# Patient Record
Sex: Male | Born: 1990 | Race: White | Hispanic: No | Marital: Married | Smoking: Current every day smoker
Health system: Southern US, Community
[De-identification: ages and names within clinical notes are randomized; demographics above are authoritative.]

## PROBLEM LIST (undated history)

## (undated) DIAGNOSIS — J45909 Unspecified asthma, uncomplicated: Secondary | ICD-10-CM

---

## 2016-04-04 ENCOUNTER — Emergency Department (HOSPITAL_COMMUNITY)
Admission: EM | Admit: 2016-04-04 | Discharge: 2016-04-04 | Disposition: A | Discharge status: Home / Self Care | Payer: Self-pay | Attending: Emergency Medicine | Admitting: Emergency Medicine

## 2016-04-04 ENCOUNTER — Encounter (HOSPITAL_COMMUNITY): Payer: Self-pay

## 2016-04-04 ENCOUNTER — Emergency Department (HOSPITAL_COMMUNITY): Payer: Self-pay

## 2016-04-04 DIAGNOSIS — Y999 Unspecified external cause status: Secondary | ICD-10-CM | POA: Insufficient documentation

## 2016-04-04 DIAGNOSIS — S8001XA Contusion of right knee, initial encounter: Secondary | ICD-10-CM | POA: Insufficient documentation

## 2016-04-04 DIAGNOSIS — W1839XA Other fall on same level, initial encounter: Secondary | ICD-10-CM | POA: Insufficient documentation

## 2016-04-04 DIAGNOSIS — F17200 Nicotine dependence, unspecified, uncomplicated: Secondary | ICD-10-CM | POA: Insufficient documentation

## 2016-04-04 DIAGNOSIS — S80211A Abrasion, right knee, initial encounter: Secondary | ICD-10-CM

## 2016-04-04 DIAGNOSIS — Y939 Activity, unspecified: Secondary | ICD-10-CM | POA: Insufficient documentation

## 2016-04-04 DIAGNOSIS — Y929 Unspecified place or not applicable: Secondary | ICD-10-CM | POA: Insufficient documentation

## 2016-04-04 MED ORDER — IBUPROFEN 600 MG PO TABS: 600.0000 mg | ORAL_TABLET | Freq: Four times a day (QID) | ORAL | 0 refills | Status: DC | PRN

## 2016-04-04 NOTE — Discharge Instructions (Signed)
Ice and elevate your knee. Bacitracin twice a day for wound care. Watch for signs of infection. Ibuprofen for pain. Follow up with your doctor or urgent care as needed.

## 2016-04-04 NOTE — ED Triage Notes (Signed)
Pt fell from the back of a truck He complains of right knee pain and an abrasion

## 2016-04-04 NOTE — ED Notes (Signed)
ED Provider at bedside. 

## 2016-04-04 NOTE — ED Provider Notes (Signed)
[facility]-EMERGENCY DEPT Provider Note   CSN: [id number] Arrival date & time: 04/04/16  0429     History   Chief Complaint Chief Complaint  Patient presents with  . Abrasion    HPI David Bowman is a 25 y.o. male.  HPI David Bowman is a 25 y.o. male presents to emergency department after a fall. Patient states that he fell from the back of the truck and landed on his right knee. He denies hitting his head on loss of consciousness. Denies any back pain. No other injuries. Patient is ambulatory but states is painful to walk on it. He's got an abrasion to the right anterior knee, states that his tetanus is up-to-date. He denies any numbness or weakness distal to the injury.  History reviewed. No pertinent past medical history.  There are no active problems to display for this patient.   History reviewed. No pertinent surgical history.     Home Medications    Prior to Admission medications   Not on File    Family History History reviewed. No pertinent family history.  Social History Social History  Substance Use Topics  . Smoking status: Current Every Day Smoker  . Smokeless tobacco: Never Used  . Alcohol use Yes     Allergies   Review of patient's allergies indicates not on file.   Review of Systems Review of Systems  Constitutional: Negative for chills and fever.  Respiratory: Negative for cough, chest tightness and shortness of breath.   Cardiovascular: Negative for chest pain, palpitations and leg swelling.  Gastrointestinal: Negative for abdominal distention, abdominal pain, diarrhea, nausea and vomiting.  Musculoskeletal: Positive for arthralgias and myalgias. Negative for neck pain and neck stiffness.  Skin: Positive for wound.  Allergic/Immunologic: Negative for immunocompromised state.  Neurological: Negative for dizziness, weakness, light-headedness, numbness and headaches.  All other systems reviewed and are negative.    Physical Exam Updated  Vital Signs BP 107/68 (BP Location: Right Arm)   Pulse 64   Temp 97.7 F (36.5 C) (Oral)   Resp 20   SpO2 99%   Physical Exam  Constitutional: He appears well-developed and well-nourished. No distress.  HENT:  Head: Normocephalic and atraumatic.  Eyes: Conjunctivae are normal.  Neck: Neck supple.  Cardiovascular: Normal rate, regular rhythm and normal heart sounds.   Pulmonary/Chest: Effort normal. No respiratory distress. He has no wheezes. He has no rales.  Musculoskeletal: He exhibits no edema.  Superficial abrasion on right anterior right knee. Mild swelling over patella. Full ROM of the right knee. TTP over patella. No medial or lateral joint tenderness. Negative anterior or posterior drawer sign. No laxity with medial or lateral stress. Patella tendon intact. DP pulses intact.   Neurological: He is alert.  Skin: Skin is warm and dry. Capillary refill takes less than 2 seconds.  Nursing note and vitals reviewed.    ED Treatments / Results  Labs (all labs ordered are listed, but only abnormal results are displayed) Labs Reviewed - No data to display  EKG  EKG Interpretation None       Radiology Dg Knee 2 Views Right  Result Date: 04/04/2016 CLINICAL DATA:  25 y/o M; fall off a back of truck landing on right knee with pain and abrasion. EXAM: RIGHT KNEE - 1-2 VIEW COMPARISON:  None. FINDINGS: No acute fracture or dislocation is identified. No joint effusion. Soft tissue swelling overlying the knee anteriorly. IMPRESSION: Soft tissue swelling overlying the knee anteriorly. No acute fracture or dislocation identified. Electronically Signed  By: Mitzi HansenLance  Furusawa-Stratton M.D.   On: 04/04/2016 05:59    Procedures Procedures (including critical care time)  Medications Ordered in ED Medications - No data to display   Initial Impression / Assessment and Plan / ED Course  I have reviewed the triage vital signs and the nursing notes.  Pertinent labs & imaging results  that were available during my care of the patient were reviewed by me and considered in my medical decision making (see chart for details).  Clinical Course    Pt with abrasion and contusion of the right anterior knee. Joint is stable. Patella tendon intact. Neurovascularly intact. Xray negative. Tetanus up to date. Home with ibuprofen, Tylenol, wound care. Follow up as needed. Bacitracin and sterile dressing applied by me in emergency department.  Vitals:   04/04/16 0523  BP: 107/68  Pulse: 64  Resp: 20  Temp: 97.7 F (36.5 C)  TempSrc: Oral  SpO2: 99%     Final Clinical Impressions(s) / ED Diagnoses   Final diagnoses:  Abrasion of right knee, initial encounter  Contusion of right knee, initial encounter    New Prescriptions Discharge Medication List as of 04/04/2016  8:07 AM    START taking these medications   Details  ibuprofen (ADVIL,MOTRIN) 600 MG tablet Take 1 tablet (600 mg total) by mouth every 6 (six) hours as needed., Starting Thu 04/04/2016, Print         Jaynie Crumbleatyana Catalino Plascencia, PA-C 04/04/16 1541    Benjiman CoreNathan Pickering, MD 04/05/16 2315

## 2016-07-04 ENCOUNTER — Emergency Department: Payer: Self-pay

## 2016-07-04 ENCOUNTER — Emergency Department
Admission: EM | Admit: 2016-07-04 | Discharge: 2016-07-04 | Disposition: A | Discharge status: Home / Self Care | Payer: Self-pay | Attending: Emergency Medicine | Admitting: Emergency Medicine

## 2016-07-04 ENCOUNTER — Encounter: Payer: Self-pay | Admitting: Emergency Medicine

## 2016-07-04 DIAGNOSIS — M6281 Muscle weakness (generalized): Secondary | ICD-10-CM | POA: Insufficient documentation

## 2016-07-04 DIAGNOSIS — R29898 Other symptoms and signs involving the musculoskeletal system: Secondary | ICD-10-CM

## 2016-07-04 DIAGNOSIS — R079 Chest pain, unspecified: Secondary | ICD-10-CM | POA: Insufficient documentation

## 2016-07-04 DIAGNOSIS — J45909 Unspecified asthma, uncomplicated: Secondary | ICD-10-CM | POA: Insufficient documentation

## 2016-07-04 DIAGNOSIS — F17210 Nicotine dependence, cigarettes, uncomplicated: Secondary | ICD-10-CM | POA: Insufficient documentation

## 2016-07-04 DIAGNOSIS — R55 Syncope and collapse: Secondary | ICD-10-CM | POA: Insufficient documentation

## 2016-07-04 HISTORY — DX: Unspecified asthma, uncomplicated: J45.909

## 2016-07-04 LAB — CBC
HCT: 41.9 % (ref 40.0–52.0)
Hemoglobin: 14.7 g/dL (ref 13.0–18.0)
MCH: 31.6 pg (ref 26.0–34.0)
MCHC: 35.1 g/dL (ref 32.0–36.0)
MCV: 90.2 fL (ref 80.0–100.0)
PLATELETS: 188 10*3/uL (ref 150–440)
RBC: 4.65 MIL/uL (ref 4.40–5.90)
RDW: 14.1 % (ref 11.5–14.5)
WBC: 6.9 10*3/uL (ref 3.8–10.6)

## 2016-07-04 LAB — BASIC METABOLIC PANEL
Anion gap: 5 (ref 5–15)
BUN: 5 mg/dL — AB (ref 6–20)
CO2: 29 mmol/L (ref 22–32)
CREATININE: 0.82 mg/dL (ref 0.61–1.24)
Calcium: 9.1 mg/dL (ref 8.9–10.3)
Chloride: 105 mmol/L (ref 101–111)
Glucose, Bld: 68 mg/dL (ref 65–99)
POTASSIUM: 3.2 mmol/L — AB (ref 3.5–5.1)
SODIUM: 139 mmol/L (ref 135–145)

## 2016-07-04 LAB — TROPONIN I: Troponin I: <0.03 ng/mL (ref <0.03)

## 2016-07-04 LAB — FIBRIN DERIVATIVES D-DIMER (ARMC ONLY): FIBRIN DERIVATIVES D-DIMER (ARMC): 168 (ref 0–499)

## 2016-07-04 NOTE — ED Provider Notes (Signed)
[facility] Emergency Department Provider Note  ____________________________________________   First MD Initiated Contact with Patient 07/04/16 1411     (approximate)  I have reviewed the triage vital signs and the nursing notes.   HISTORY  Chief Complaint Chest Pain   HPI David Bowman is a 26 y.o. male with a history of asthma who is presenting to the emergency department today with left-sided chest pain as well as a syncopal episode and left-sided arm weakness. He says that he was leaning up against a wall smoking a cigarette and then suddenly woke up on the ground. He said that was when his chest pain started. He noted this was about 10:30 AM this morning. He also noted at this time that she was having left upper extremity weakness which she says is greatly improved. He says that the chest pain has gone at this time but that one was there was a sharp, pressure-like pain to the left upper portion of his chest. He denies it being associated with any shortness of breath, nausea, vomiting or diaphoresis. He says that over the past several months he is also felt intermittent episodes of dizziness as well as blurred vision. He also says that he is under a considerable amount of stress lately because he has broken up with his fiance who is pregnant at this time. He denies any history of heart disease in young age and his family or anybody who is died suddenly at a young age.   Past Medical History:  Diagnosis Date  . Asthma     There are no active problems to display for this patient.   History reviewed. No pertinent surgical history.  Prior to Admission medications   Medication Sig Start Date End Date Taking? Authorizing Provider  ibuprofen (ADVIL,MOTRIN) 600 MG tablet Take 1 tablet (600 mg total) by mouth every 6 (six) hours as needed. 04/04/16   Tatyana Kirichenko, PA-C    Allergies Amoxicillin and Penicillins  History reviewed. No pertinent family  history.  Social History Social History  Substance Use Topics  . Smoking status: Current Every Day Smoker    Packs/day: 2.00    Types: Cigarettes  . Smokeless tobacco: Never Used  . Alcohol use Yes     Comment: rare    Review of Systems Constitutional: No fever/chills Eyes: Intermittent blurred vision but denies at this time. Says the last time he had blurred vision with about 5 days ago. ENT: No sore throat. Cardiovascular:  As above Respiratory: Denies shortness of breath. Gastrointestinal: No abdominal pain.  No nausea, no vomiting.  No diarrhea.  No constipation. Genitourinary: Negative for dysuria. Musculoskeletal: Negative for back pain. Skin: Negative for rash. Neurological: Negative for headaches, focal numbness.  10-point ROS otherwise negative.  ____________________________________________   PHYSICAL EXAM:  VITAL SIGNS: ED Triage Vitals  Enc Vitals Group     BP 07/04/16 1206 122/78     Pulse Rate 07/04/16 1206 81     Resp 07/04/16 1206 16     Temp 07/04/16 1206 98.3 F (36.8 C)     Temp Source 07/04/16 1206 Oral     SpO2 07/04/16 1206 100 %     Weight 07/04/16 1202 145 lb (65.8 kg)     Height 07/04/16 1202 5\' 9"  (1.753 m)     Head Circumference --      Peak Flow --      Pain Score 07/04/16 1203 6     Pain Loc --  Pain Edu? --      Excl. in GC? --     Constitutional: Alert and oriented. Well appearing and in no acute distress. Eyes: Conjunctivae are normal. PERRL. EOMI. Head: Atraumatic. Nose: No congestion/rhinnorhea. Mouth/Throat: Mucous membranes are moist.  Oropharynx non-erythematous. Neck: No stridor.   Cardiovascular: Normal rate, regular rhythm. Grossly normal heart sounds.  Good peripheral circulation.Bilateral equal radial pulses. Respiratory: Normal respiratory effort.  No retractions. Lungs CTAB. Gastrointestinal: Soft and nontender. No distention. No abdominal bruits. No CVA tenderness. Musculoskeletal: No lower extremity  tenderness nor edema.  No joint effusions. Neurologic:  Normal speech and language. No gross focal neurologic deficits are appreciated. The 5 strength bilateral upper as well as lower extremities. No gait instability. Skin:  Skin is warm, dry and intact. No rash noted. Psychiatric: Mood and affect are normal. Speech and behavior are normal.  ____________________________________________   LABS (all labs ordered are listed, but only abnormal results are displayed)  Labs Reviewed  BASIC METABOLIC PANEL - Abnormal; Notable for the following:       Result Value   Potassium 3.2 (*)    BUN 5 (*)    All other components within normal limits  CBC  TROPONIN I  TROPONIN I  FIBRIN DERIVATIVES D-DIMER ([facility] ONLY)   ____________________________________________  EKG  ED ECG REPORT I, Arelia LongestSchaevitz,  David M, the attending physician, personally viewed and interpreted this ECG.   Date: 07/04/2016  EKG Time: 1201  Rate: 78  Rhythm: normal sinus rhythm  Axis: Normal  Intervals:none  ST&T Change: No ST segment elevation or depression. No abnormal T-wave inversion.  ____________________________________________  RADIOLOGY  CT Head Wo Contrast (Final result)  Result time 07/04/16 14:41:01  Final result by Corky SoxArthur Maynard, MD (07/04/16 14:41:01)           Narrative:   CLINICAL DATA: Syncopal episode with fall. Left upper extremity weakness.  EXAM: CT HEAD WITHOUT CONTRAST  TECHNIQUE: Contiguous axial images were obtained from the base of the skull through the vertex without intravenous contrast.  COMPARISON: None.  FINDINGS: Brain: Ventricles are normal in size and configuration. All areas of the brain demonstrate normal gray-white matter attenuation. There is no mass, hemorrhage, edema or other evidence of acute parenchymal abnormality. No extra-axial hemorrhage.  Vascular: No hyperdense vessel or unexpected calcification.  Skull: Normal. Negative for fracture or focal  lesion.  Sinuses/Orbits: No acute finding.  Other: None.  IMPRESSION: Normal head CT.   Electronically Signed By: Bary RichardStan Maynard M.D. On: 07/04/2016 14:41            DG Chest 2 View (Final result)  Result time 07/04/16 12:49:08  Final result by David A SwazilandJordan, MD (07/04/16 12:49:08)           Narrative:   CLINICAL DATA: Dizziness and syncopal episode at work this morning. Patient awakened on the ground. Intermittent episodes of left-sided chest pain, dizziness, and blurred vision for the past 6 months. History of asthma, current smoking, childhood head injury.  EXAM: CHEST 2 VIEW  COMPARISON: None in PACs  FINDINGS: The lungs are mildly hyperinflated with hemidiaphragm flattening. There is no focal infiltrate. The interstitial markings are coarse. There is no pleural effusion. The heart and pulmonary vascularity are normal. The mediastinum is normal in width. The trachea is midline. The bony thorax exhibits no acute abnormality.  IMPRESSION: No alveolar pneumonia nor other acute cardiopulmonary abnormality. Mild interstitial prominence may reflect patient has history of asthma as well as smoking.   Electronically Signed  By: David [place] M.D. On: 07/04/2016 12:49            ____________________________________________   PROCEDURES  Procedure(s) performed:   Procedures  Critical Care performed:   ____________________________________________   INITIAL IMPRESSION / ASSESSMENT AND PLAN / ED COURSE  Pertinent labs & imaging results that were available during my care of the patient were reviewed by me and considered in my medical decision making (see chart for details).  ----------------------------------------- 3:26 PM on 07/04/2016 -----------------------------------------  Patient resting comfortable at this time. No new complaints. Very reassuring workup with 2 negative troponins as well as a normal d-dimer. D-dimer makes a  blood clot very unlikely as well as dissection more unlikely.  I will give the patient primary care as well as cardiology follow-up. He is understanding of the plan and willing to comply. Says that he has been under a lot of stress because of the "taking time off" with his fiance. Possibly stress related as well.      ____________________________________________   FINAL CLINICAL IMPRESSION(S) / ED DIAGNOSES  Chest pain. Take me. Weakness of the left upper extremity.    NEW MEDICATIONS STARTED DURING THIS VISIT:  New Prescriptions   No medications on file     Note:  This document was prepared using Dragon voice recognition software and may include unintentional dictation errors.    Myrna Blazer, MD 07/04/16 [id number]

## 2016-07-04 NOTE — ED Notes (Signed)
Patient transported to X-ray 

## 2016-07-04 NOTE — ED Notes (Signed)
Blue top tube sent to lab for d-dimer analysis.

## 2016-07-04 NOTE — ED Triage Notes (Signed)
Pt to ED from home c/o stabbing pain under left collar bone x4-5 days.  Denies n/v/d.  A&Ox4, speaking in complete and coherent sentences, chest rise even and unlabored, skin warm and dry.

## 2017-02-22 ENCOUNTER — Encounter: Payer: Self-pay | Admitting: Emergency Medicine

## 2017-02-22 ENCOUNTER — Emergency Department
Admission: EM | Admit: 2017-02-22 | Discharge: 2017-02-22 | Disposition: A | Discharge status: Home / Self Care | Payer: Self-pay | Attending: Emergency Medicine | Admitting: Emergency Medicine

## 2017-02-22 DIAGNOSIS — Z77098 Contact with and (suspected) exposure to other hazardous, chiefly nonmedicinal, chemicals: Secondary | ICD-10-CM | POA: Insufficient documentation

## 2017-02-22 DIAGNOSIS — J45909 Unspecified asthma, uncomplicated: Secondary | ICD-10-CM | POA: Insufficient documentation

## 2017-02-22 DIAGNOSIS — F17210 Nicotine dependence, cigarettes, uncomplicated: Secondary | ICD-10-CM | POA: Insufficient documentation

## 2017-02-22 DIAGNOSIS — J029 Acute pharyngitis, unspecified: Secondary | ICD-10-CM | POA: Insufficient documentation

## 2017-02-22 DIAGNOSIS — R112 Nausea with vomiting, unspecified: Secondary | ICD-10-CM | POA: Insufficient documentation

## 2017-02-22 LAB — BASIC METABOLIC PANEL
Anion gap: 6 (ref 5–15)
BUN: 8 mg/dL (ref 6–20)
CHLORIDE: 105 mmol/L (ref 101–111)
CO2: 27 mmol/L (ref 22–32)
Calcium: 9 mg/dL (ref 8.9–10.3)
Creatinine, Ser: 0.94 mg/dL (ref 0.61–1.24)
GFR calc non Af Amer: >60 mL/min (ref >60)
GLUCOSE: 81 mg/dL (ref 65–99)
POTASSIUM: 3.2 mmol/L — AB (ref 3.5–5.1)
SODIUM: 138 mmol/L (ref 135–145)

## 2017-02-22 LAB — CBC
HEMATOCRIT: 43.4 % (ref 40.0–52.0)
Hemoglobin: 14.9 g/dL (ref 13.0–18.0)
MCH: 31.3 pg (ref 26.0–34.0)
MCHC: 34.3 g/dL (ref 32.0–36.0)
MCV: 91.2 fL (ref 80.0–100.0)
Platelets: 155 10*3/uL (ref 150–440)
RBC: 4.76 MIL/uL (ref 4.40–5.90)
RDW: 14.2 % (ref 11.5–14.5)
WBC: 7.7 10*3/uL (ref 3.8–10.6)

## 2017-02-22 LAB — URINALYSIS, COMPLETE (UACMP) WITH MICROSCOPIC
BACTERIA UA: NONE SEEN
BILIRUBIN URINE: NEGATIVE
Glucose, UA: NEGATIVE mg/dL
Hgb urine dipstick: NEGATIVE
Ketones, ur: NEGATIVE mg/dL
Leukocytes, UA: NEGATIVE
Nitrite: NEGATIVE
PROTEIN: NEGATIVE mg/dL
SPECIFIC GRAVITY, URINE: 1.005 (ref 1.005–1.030)
WBC, UA: NONE SEEN WBC/hpf (ref 0–5)
pH: 6 (ref 5.0–8.0)

## 2017-02-22 MED ORDER — ONDANSETRON 4 MG PO TBDP
4.0000 mg | ORAL_TABLET | Freq: Once | ORAL | Status: AC
  Administered 2017-02-22: 4 mg via ORAL
  Filled 2017-02-22: qty 1

## 2017-02-22 MED ORDER — ALBUTEROL SULFATE HFA 108 (90 BASE) MCG/ACT IN AERS: 2.0000 | INHALATION_SPRAY | Freq: Four times a day (QID) | RESPIRATORY_TRACT | 2 refills | Status: AC | PRN

## 2017-02-22 NOTE — ED Triage Notes (Signed)
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 No respiratory distress noted. Ambulatory without difficulty.  No visible rash or skin irritation.

## 2017-02-22 NOTE — Discharge Instructions (Signed)
Return to the ER if you are having difficulty breathing, wheezing, hives, if you start to vomit again. 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

## 2017-02-22 NOTE — ED Provider Notes (Addendum)
[facility] Emergency Department Provider Note  ____________________________________________  Time seen: Approximately 9:43 AM  I have reviewed the triage vital signs and the nursing notes.   HISTORY  Chief Complaint Dizziness and Sore Throat   HPI David Bowman is a 26 y.o. male with a history of asthma who presents for evaluation of dizziness and sore throat. 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 He felt dizzy like he was going to pass out and started feeling severe constant urning sensation in the back of his throat. He had one episode of NBNB vomiting. He came to the emergency room because he was concerned he was having an allergic reaction.Patient feels back to normal at this time with just mild sore throat. No fever, chills, SOB, CP, diarrhea.   Past Medical History:  Diagnosis Date  . Asthma     There are no active problems to display for this patient.   History reviewed. No pertinent surgical history.  Prior to Admission medications   Medication Sig Start Date End Date Taking? Authorizing Provider  ibuprofen (ADVIL,MOTRIN) 600 MG tablet Take 1 tablet (600 mg total) by mouth every 6 (six) hours as needed. 04/04/16   Kirichenko, Lemont Fillers, PA-C    Allergies Amoxicillin and Penicillins  History reviewed. No pertinent family history.  Social History Social History  Substance Use Topics  . Smoking status: Current Every Day Smoker    Packs/day: 2.00    Types: Cigarettes  . Smokeless tobacco: Never Used  . Alcohol use Yes     Comment: rare    Review of Systems  Constitutional: Negative for fever. + dizziness Eyes: Negative for visual changes. ENT: + sore throat. Neck: No neck pain  Cardiovascular: Negative for chest pain. Respiratory: Negative for shortness of breath. Gastrointestinal: Negative for abdominal pain,  Diarrhea. + N/V Genitourinary:  Negative for dysuria. Musculoskeletal: Negative for back pain. Skin: Negative for rash. Neurological: Negative for headaches, weakness or numbness. Psych: No SI or HI  ____________________________________________   PHYSICAL EXAM:  VITAL SIGNS: ED Triage Vitals  Enc Vitals Group     BP 02/22/17 0821 137/68     Pulse Rate 02/22/17 0820 88     Resp 02/22/17 0820 16     Temp 02/22/17 0820 97.7 F (36.5 C)     Temp Source 02/22/17 0820 Oral     SpO2 02/22/17 0820 99 %     Weight 02/22/17 0821 145 lb (65.8 kg)     Height 02/22/17 0821  (1.753 m)     Head Circumference --      Peak Flow --      Pain Score 02/22/17 0820 2     Pain Loc --      Pain Edu? --      Excl. in GC? --     Constitutional: Alert and oriented. Well appearing and in no apparent distress. HEENT:      Head: Normocephalic and atraumatic.         Eyes: Conjunctivae are normal. Sclera is non-icteric.       Mouth/Throat: Mucous membranes are moist. oropharynx is clear with no swelling or erythema, no exudates, uvula is midline with no swelling, tongue has no swelling, airways patent, no stridor      Neck: Supple with no signs of meningismus. Cardiovascular: Regular rate and rhythm. No murmurs, gallops, or rubs. 2+ symmetrical distal pulses  are present in all extremities. No JVD. Respiratory: Normal respiratory effort. Lungs are clear to auscultation bilaterally. No wheezes, crackles, or rhonchi.  Gastrointestinal: Soft, non tender, and non distended with positive bowel sounds. No rebound or guarding. Musculoskeletal: Nontender with normal range of motion in all extremities. No edema, cyanosis, or erythema of extremities. Neurologic: Normal speech and language. Face is symmetric. Moving all extremities. No gross focal neurologic deficits are appreciated. Skin: Skin is warm, dry and intact. No rash noted. Psychiatric: Mood and affect are normal. Speech and behavior are  normal.  ____________________________________________   LABS (all labs ordered are listed, but only abnormal results are displayed)  Labs Reviewed  BASIC METABOLIC PANEL - Abnormal; Notable for the following:       Result Value   Potassium 3.2 (*)    All other components within normal limits  URINALYSIS, COMPLETE (UACMP) WITH MICROSCOPIC - Abnormal; Notable for the following:    Color, Urine STRAW (*)    APPearance CLEAR (*)    Squamous Epithelial / LPF 0-5 (*)    All other components within normal limits  CBC  CBG MONITORING, ED   ____________________________________________  EKG  ED ECG REPORT I, Nita Sickle, the attending physician, personally viewed and interpreted this ECG.  Normal sinus rhythm, rate of 76, normal intervals, normal axis, no ST elevations or depressions. ____________________________________________  RADIOLOGY  none  ____________________________________________   PROCEDURES  Procedure(s) performed: None Procedures Critical Care performed:  None ____________________________________________   INITIAL IMPRESSION / ASSESSMENT AND PLAN / ED COURSE  26 y.o. 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 Patient feels back to normal. No evidence of allergic reaction or anaphylaxis. I offered to give IVF but patient wishes to go home since he is feeling back to normal. No respiratory distress, no wheezing, normal vitals and wob, no stridor. Will dc home, discussed signs of symptoms of acute respiratory distress or anaphylaxis and recommended that he returns to the ER if those develop.  Patient has h/o asthma however has not used an inhaler in several years. Due to exposure to fumes, I will provide patient with prescription for albuterol inhaler in case he needs it. There is no evidence of asthma exacerbation at this time      Pertinent labs & imaging results that were available during my care of the patient were reviewed by me and considered in my medical decision making (see chart for details).    ____________________________________________   FINAL CLINICAL IMPRESSION(S) / ED DIAGNOSES  Final diagnoses:  Exposure to industrial fumes  Non-intractable vomiting with nausea, unspecified vomiting type      NEW MEDICATIONS STARTED DURING THIS VISIT:  New Prescriptions   No medications on file     Note:  This document was prepared using Dragon voice recognition software and may include unintentional dictation errors.    Don Perking, [place], MD 02/22/17 [id number]    Nita Sickle, MD 02/22/17 [id number]

## 2017-02-22 NOTE — ED Triage Notes (Signed)
Discussed pt with dr lord. 

## 2017-02-22 NOTE — ED Triage Notes (Signed)
Not workers comp per pt

## 2017-07-09 ENCOUNTER — Encounter: Payer: Self-pay | Admitting: Emergency Medicine

## 2017-07-09 ENCOUNTER — Other Ambulatory Visit: Payer: Self-pay

## 2017-07-09 ENCOUNTER — Emergency Department
Admission: EM | Admit: 2017-07-09 | Discharge: 2017-07-09 | Disposition: A | Discharge status: Home / Self Care | Payer: Self-pay | Attending: Emergency Medicine | Admitting: Emergency Medicine

## 2017-07-09 DIAGNOSIS — F17210 Nicotine dependence, cigarettes, uncomplicated: Secondary | ICD-10-CM | POA: Insufficient documentation

## 2017-07-09 DIAGNOSIS — W540XXA Bitten by dog, initial encounter: Secondary | ICD-10-CM | POA: Insufficient documentation

## 2017-07-09 DIAGNOSIS — S61051A Open bite of right thumb without damage to nail, initial encounter: Secondary | ICD-10-CM | POA: Insufficient documentation

## 2017-07-09 DIAGNOSIS — Z23 Encounter for immunization: Secondary | ICD-10-CM | POA: Insufficient documentation

## 2017-07-09 DIAGNOSIS — Y9389 Activity, other specified: Secondary | ICD-10-CM | POA: Insufficient documentation

## 2017-07-09 DIAGNOSIS — S61011A Laceration without foreign body of right thumb without damage to nail, initial encounter: Secondary | ICD-10-CM

## 2017-07-09 DIAGNOSIS — Y929 Unspecified place or not applicable: Secondary | ICD-10-CM | POA: Insufficient documentation

## 2017-07-09 DIAGNOSIS — J45909 Unspecified asthma, uncomplicated: Secondary | ICD-10-CM | POA: Insufficient documentation

## 2017-07-09 DIAGNOSIS — Y998 Other external cause status: Secondary | ICD-10-CM | POA: Insufficient documentation

## 2017-07-09 MED ORDER — DOXYCYCLINE HYCLATE 100 MG PO TABS
100.0000 mg | ORAL_TABLET | Freq: Once | ORAL | Status: AC
  Administered 2017-07-09: 100 mg via ORAL
  Filled 2017-07-09: qty 1

## 2017-07-09 MED ORDER — DOXYCYCLINE HYCLATE 100 MG PO TABS: 100.0000 mg | ORAL_TABLET | Freq: Two times a day (BID) | ORAL | 0 refills | Status: DC

## 2017-07-09 MED ORDER — LIDOCAINE HCL (PF) 1 % IJ SOLN
INTRAMUSCULAR | Status: AC
  Administered 2017-07-09: 5 mL via INTRADERMAL
  Filled 2017-07-09: qty 5

## 2017-07-09 MED ORDER — TETANUS-DIPHTH-ACELL PERTUSSIS 5-2.5-18.5 LF-MCG/0.5 IM SUSP
0.5000 mL | Freq: Once | INTRAMUSCULAR | Status: AC
  Administered 2017-07-09: 0.5 mL via INTRAMUSCULAR

## 2017-07-09 MED ORDER — CLINDAMYCIN HCL 300 MG PO CAPS: 300.0000 mg | ORAL_CAPSULE | Freq: Three times a day (TID) | ORAL | 0 refills | Status: AC

## 2017-07-09 MED ORDER — TRAMADOL HCL 50 MG PO TABS: 50.0000 mg | ORAL_TABLET | Freq: Four times a day (QID) | ORAL | 0 refills | Status: DC | PRN

## 2017-07-09 MED ORDER — LIDOCAINE HCL (PF) 1 % IJ SOLN
5.0000 mL | Freq: Once | INTRAMUSCULAR | Status: AC
  Administered 2017-07-09: 5 mL via INTRADERMAL

## 2017-07-09 MED ORDER — CLINDAMYCIN HCL 150 MG PO CAPS
300.0000 mg | ORAL_CAPSULE | Freq: Once | ORAL | Status: AC
  Administered 2017-07-09: 300 mg via ORAL
  Filled 2017-07-09: qty 2

## 2017-07-09 MED ORDER — TETANUS-DIPHTH-ACELL PERTUSSIS 5-2.5-18.5 LF-MCG/0.5 IM SUSP
INTRAMUSCULAR | Status: AC
  Administered 2017-07-09: 0.5 mL via INTRAMUSCULAR
  Filled 2017-07-09: qty 0.5

## 2017-07-09 MED ORDER — BACITRACIN ZINC 500 UNIT/GM EX OINT
TOPICAL_OINTMENT | CUTANEOUS | Status: AC
  Administered 2017-07-09: 1 via TOPICAL
  Filled 2017-07-09: qty 0.9

## 2017-07-09 MED ORDER — BACITRACIN ZINC 500 UNIT/GM EX OINT
TOPICAL_OINTMENT | Freq: Once | CUTANEOUS | Status: AC
Start: 2017-07-09 — End: 2017-07-09
  Administered 2017-07-09: 1 via TOPICAL

## 2017-07-09 NOTE — ED Provider Notes (Signed)
[facility] Emergency Department Provider Note   ____________________________________________   First MD Initiated Contact with Patient 07/09/17 0501     (approximate)  I have reviewed the triage vital signs and the nursing notes.   HISTORY  Chief Complaint Animal Bite    HPI David Bowman is a 27 y.o. male who comes into the hospital today with a dog bite to his right hand.  The patient reports that his neighbors dog was pulling at his dog.  He went out to find out what was going on and noticed the dogs trying to fight.  The patient states that he went in and tried to SWAT the dog away with his hand.  He reports that 1 of the dogs bit onto his hand and it was his neighbor's dog.  He reports that he does not know why they were aggressive as he has had interactions with this dog in the past.  The patient reports that his neighbors told him the dogs were up-to-date on the shots but they did not have any actual papers.  The patient's last tetanus shot was when he was 27 years old.  He states that his pain is a 7 out of 10 in intensity.  He states that his hand is throbbing and the pain started to shoot up his wrist.  He states that his thumb feels numb. He is here for evaluation and repair.   Past Medical History:  Diagnosis Date  . Asthma     There are no active problems to display for this patient.   History reviewed. No pertinent surgical history.  Prior to Admission medications   Medication Sig Start Date End Date Taking? Authorizing Provider  albuterol (PROVENTIL HFA;VENTOLIN HFA) 108 (90 Base) MCG/ACT inhaler Inhale 2 puffs into the lungs every 6 (six) hours as needed for wheezing or shortness of breath. 02/22/17   Nita Sickle, MD  clindamycin (CLEOCIN) 300 MG capsule Take 1 capsule (300 mg total) by mouth 3 (three) times daily for 10 days. 07/09/17 07/19/17  Rebecka Apley, MD  doxycycline (VIBRA-TABS) 100 MG tablet Take 1 tablet (100 mg total)  by mouth 2 (two) times daily. 07/09/17   Rebecka Apley, MD  ibuprofen (ADVIL,MOTRIN) 600 MG tablet Take 1 tablet (600 mg total) by mouth every 6 (six) hours as needed. Patient not taking: Reported on 02/22/2017 04/04/16   Jaynie Crumble, PA-C  traMADol (ULTRAM) 50 MG tablet Take 1 tablet (50 mg total) by mouth every 6 (six) hours as needed. 07/09/17   Rebecka Apley, MD    Allergies Sulfa antibiotics; Amoxicillin; and Penicillins  No family history on file.  Social History Social History   Tobacco Use  . Smoking status: Current Every Day Smoker    Packs/day: 2.00    Types: Cigarettes  . Smokeless tobacco: Never Used  Substance Use Topics  . Alcohol use: Yes    Comment: rare  . Drug use: No    Review of Systems  Constitutional: No fever/chills Eyes: No visual changes. ENT: No sore throat. Cardiovascular: Denies chest pain. Respiratory: Denies shortness of breath. Gastrointestinal: No abdominal pain.  No nausea, no vomiting.  No diarrhea.  No constipation. Genitourinary: Negative for dysuria. Musculoskeletal: Negative for back pain. Skin: laceration to base of right thumb Neurological: Negative for headaches, focal weakness or numbness.   ____________________________________________   PHYSICAL EXAM:  VITAL SIGNS: ED Triage Vitals  Enc Vitals Group     BP 07/09/17 0325 129/72  Pulse Rate 07/09/17 0325 64     Resp 07/09/17 0325 16     Temp 07/09/17 0325 97.7 F (36.5 C)     Temp Source 07/09/17 0325 Oral     SpO2 07/09/17 0325 98 %     Weight 07/09/17 0322 145 lb (65.8 kg)     Height 07/09/17 0322 5\' 9"  (1.753 m)     Head Circumference --      Peak Flow --      Pain Score 07/09/17 0322 7     Pain Loc --      Pain Edu? --      Excl. in GC? --     Constitutional: Alert and oriented. Well appearing and in mild distress. Eyes: Conjunctivae are normal. PERRL. EOMI. Head: Atraumatic. Nose: No congestion/rhinnorhea. Mouth/Throat: Mucous membranes  are moist.  Oropharynx non-erythematous. Cardiovascular: Normal rate, regular rhythm. Grossly normal heart sounds.  Good peripheral circulation. Respiratory: Normal respiratory effort.  No retractions. Lungs CTAB. Gastrointestinal: Soft and nontender. No distention. Positive bowel sounds Musculoskeletal: No lower extremity tenderness nor edema.   Neurologic:  Normal speech and language.  Skin:  Skin is warm, dry approximately 4 cm laceration to the dorsum of the base of the right thumb the patient is able to move his thumb with some pain but he reports that the tip of his thumb feels numb.  There is minimal bleeding and no visible tendon injury. Psychiatric: Mood and affect are normal.   ____________________________________________   LABS (all labs ordered are listed, but only abnormal results are displayed)  Labs Reviewed - No data to display ____________________________________________  EKG  none ____________________________________________  RADIOLOGY  ED MD interpretation:  none  Official radiology report(s): No results found.  ____________________________________________   PROCEDURES  Procedure(s) performed: please, see procedure note(s).  Marland Kitchen.Laceration Repair Date/Time: 07/09/2017 5:40 AM Performed by: Rebecka Apley, MD Authorized by: Rebecka Apley, MD   Consent:    Consent obtained:  Verbal   Consent given by:  Patient   Risks discussed:  Infection, pain, retained foreign body, poor cosmetic result and poor wound healing Anesthesia (see MAR for exact dosages):    Anesthesia method:  Local infiltration   Local anesthetic:  Lidocaine 1% w/o epi Laceration details:    Location:  Finger   Finger location:  R thumb   Length (cm):  4.5 Repair type:    Repair type:  Simple Pre-procedure details:    Preparation:  Patient was prepped and draped in usual sterile fashion Exploration:    Hemostasis achieved with:  Direct pressure   Wound exploration: entire  depth of wound probed and visualized     Contaminated: no   Treatment:    Area cleansed with:  Saline and Betadine   Amount of cleaning:  Standard   Irrigation solution:  Sterile saline   Irrigation method:  Syringe   Visualized foreign bodies/material removed: no   Skin repair:    Repair method:  Sutures   Suture size:  4-0   Suture material:  Nylon   Suture technique:  Simple interrupted   Number of sutures:  2 Approximation:    Approximation:  Loose Post-procedure details:    Dressing:  Sterile dressing, antibiotic ointment and bulky dressing   Patient tolerance of procedure:  Tolerated well, no immediate complications    Critical Care performed: No  ____________________________________________   INITIAL IMPRESSION / ASSESSMENT AND PLAN / ED COURSE  As part of my medical decision making, I reviewed  the following data within the electronic MEDICAL RECORD NUMBER Notes from prior ED visits and [place] Controlled Substance Database   This is a 27 year old male who comes into the hospital today with a dog bite to the right thumb.  The area is open and gaping with some active bleeding.  The patient has not had a tetanus in 10 years so he will receive a tetanus shot today.  The patient is penicillin allergic so he will receive a dose of doxycycline and clindamycin.  I did loosely repair the wound with 2 sutures so that it was not gaping open but could still drain should any infection develop.  The patient felt well.  He will be discharged home to follow-up with hand surgery if he still has numbness in his finger and should return if he has any worsening pain, redness, drainage or any other concerns.      ____________________________________________   FINAL CLINICAL IMPRESSION(S) / ED DIAGNOSES  Final diagnoses:  Dog bite, initial encounter  Laceration of right thumb without foreign body without damage to nail, initial encounter     ED Discharge Orders        Ordered     doxycycline (VIBRA-TABS) 100 MG tablet  2 times daily     07/09/17 0604    clindamycin (CLEOCIN) 300 MG capsule  3 times daily     07/09/17 0604    traMADol (ULTRAM) 50 MG tablet  Every 6 hours PRN     07/09/17 0604       Note:  This document was prepared using Dragon voice recognition software and may include unintentional dictation errors.    Rebecka ApleyWebster, Donna Silverman P, MD 07/09/17 [id number]

## 2017-07-09 NOTE — ED Notes (Signed)
Topez not working 

## 2017-07-09 NOTE — Discharge Instructions (Signed)
Please follow-up with hand surgery if you have some persistent numbness to your fingertip.  It is likely from swelling and inflammation.  Please have your sutures removed in 10 days.  The wound was closed loosely to allow further drainage of any possible infection.  Please return with any erythema purulent drainage or any other concerns.

## 2017-07-09 NOTE — ED Triage Notes (Addendum)
Pt in with dog bite to right hand with 1 inch lac between 1st and 2nd fingers. States it was neighbors dog and is unsure of vaccinations status.

## 2018-08-30 IMAGING — CT CT HEAD W/O CM
4 series · 17 of 47 positions shown, 19 images · non-contrast
Comparison: None.

CLINICAL DATA: Syncopal episode with fall. Left upper extremity
weakness.

EXAM:
CT HEAD WITHOUT CONTRAST
TECHNIQUE: Contiguous axial images were obtained from the base of the skull
through the vertex without intravenous contrast.

[Series 2: head wo · axial · 0.46mm/px · z∈[-104,+11]mm · 7 of 31 slices shown, 9 images]
[im 4/31  brain]
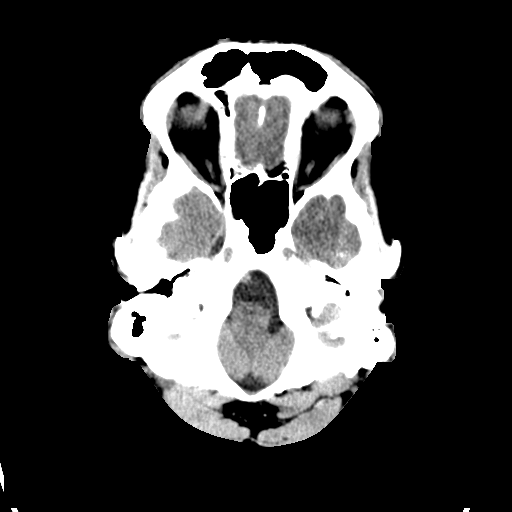
[im 4/31  bone]
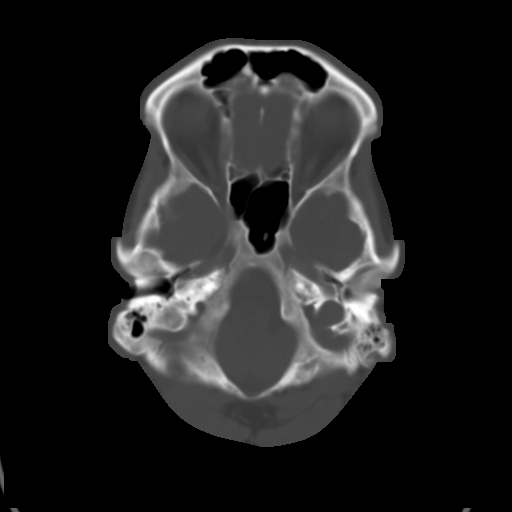
[im 8/31  brain]
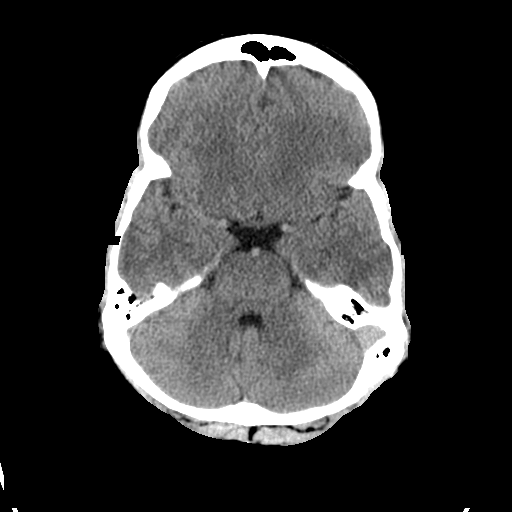
[im 12/31  brain]
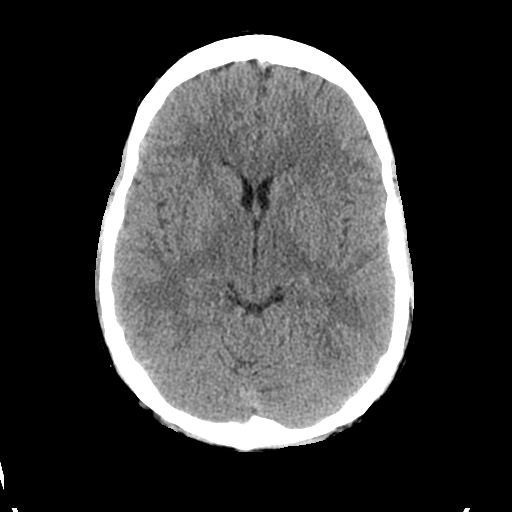
[im 16/31  brain]
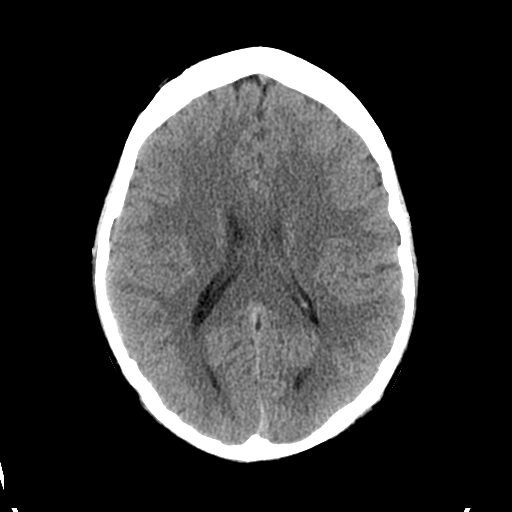
[im 19/31  brain]
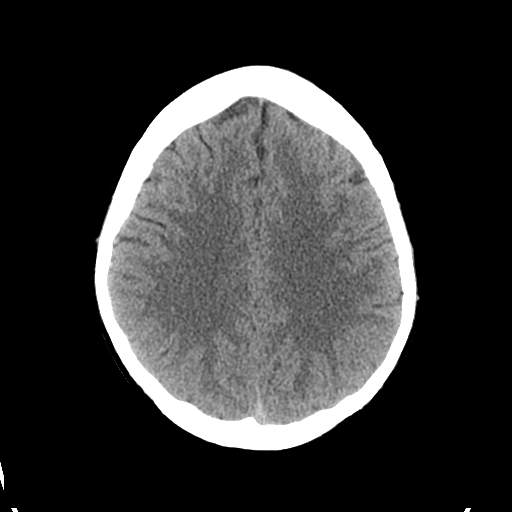
[im 19/31  bone]
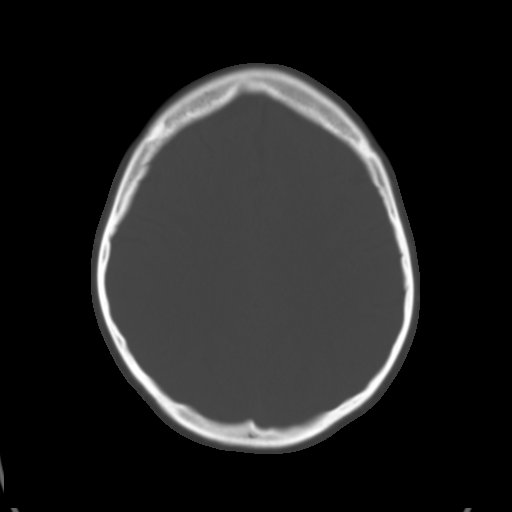
[im 23/31  brain]
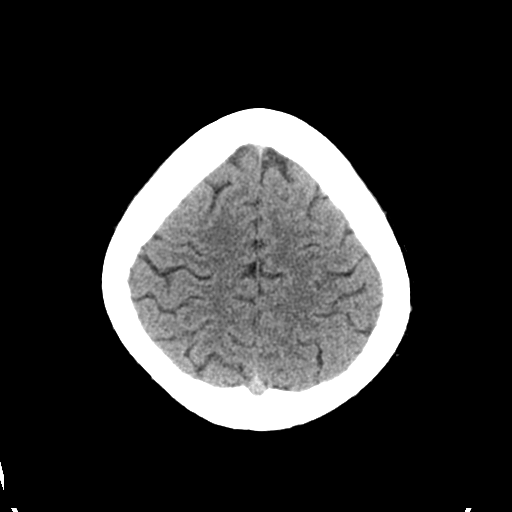
[im 27/31  brain]
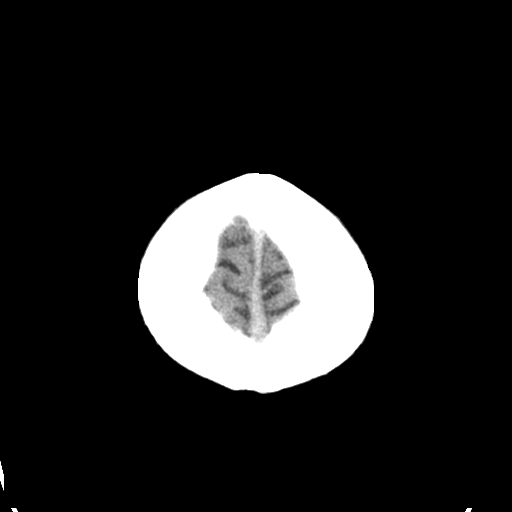

[Series 3: head bone · axial · 0.46mm/px · z∈[-105,-51]mm · 4 of 78 slices shown]
[im 8/78  bone]
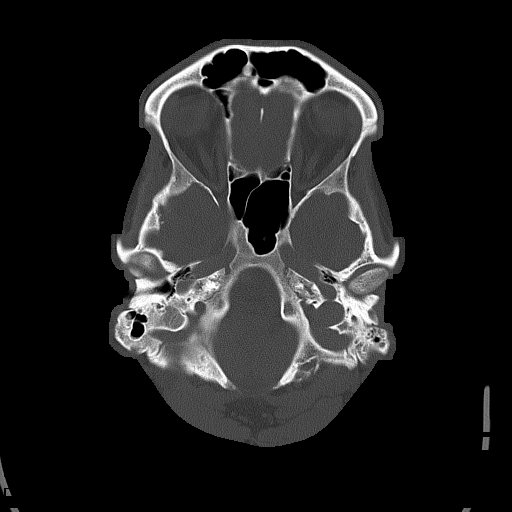
[im 16/78  bone]
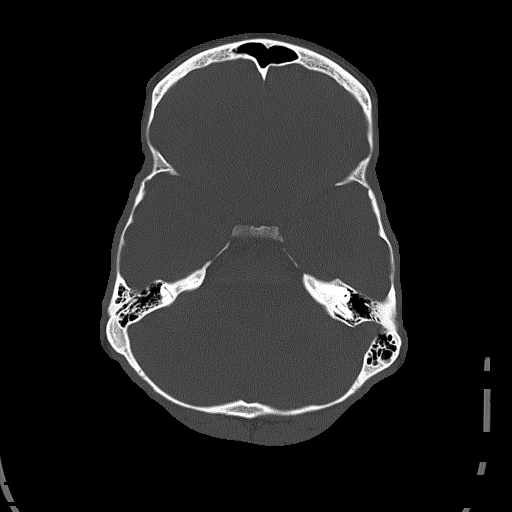
[im 24/78  bone]
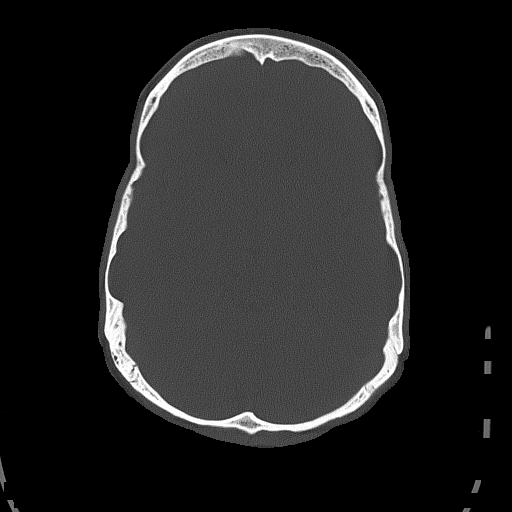
[im 35/78  bone]
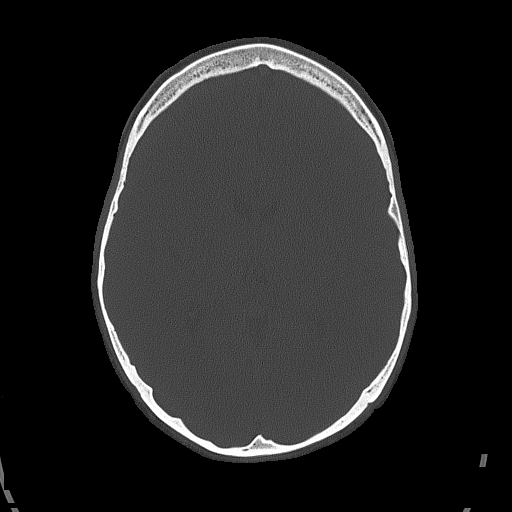

[Series 4: coronal soft tissue · coronal · 0.32mm/px · 3 of 70 slices shown]
[im 24/70  brain]
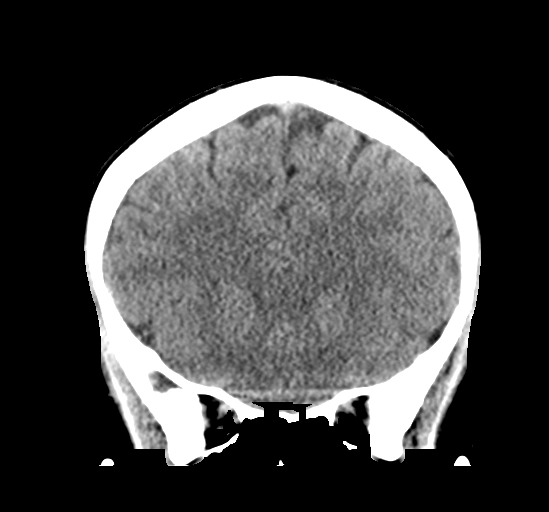
[im 31/70  brain]
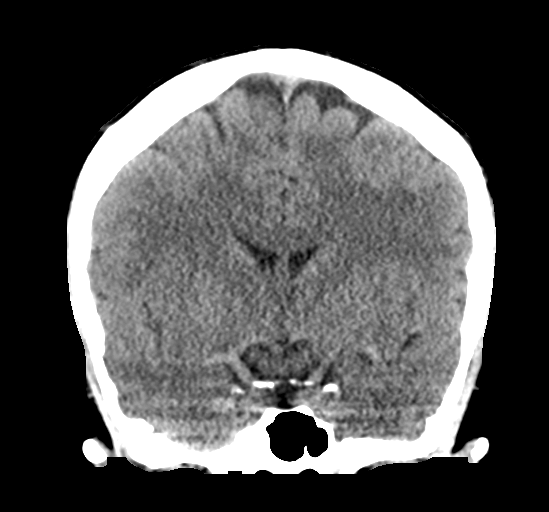
[im 39/70  brain]
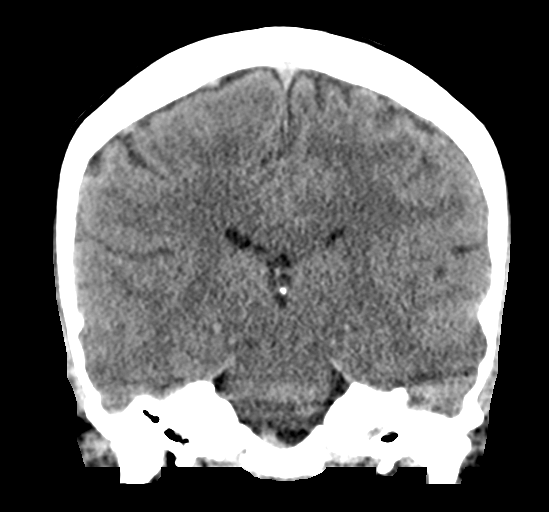

[Series 5: sagittal soft tissue · sagittal · 0.34mm/px · 3 of 52 slices shown]
[im 18/52  brain]
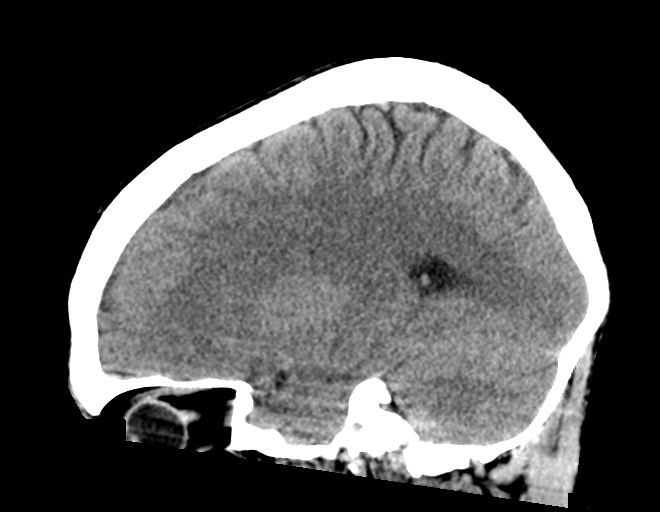
[im 26/52  brain]
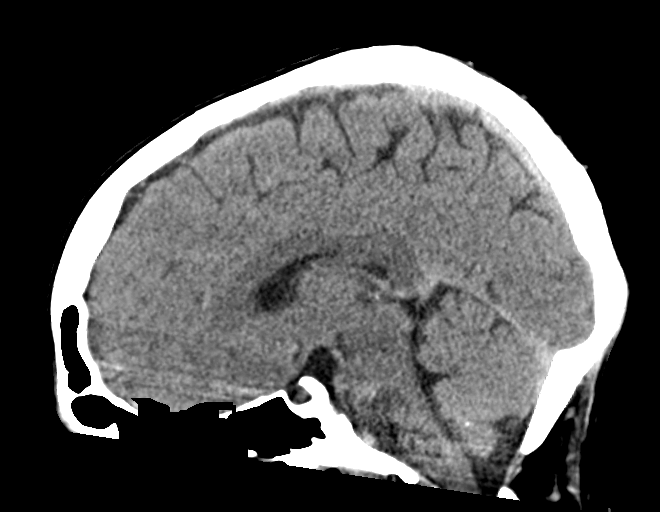
[im 35/52  brain]
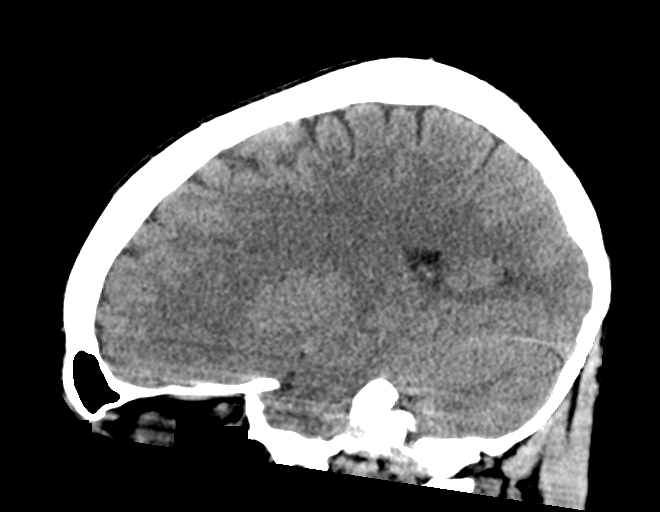

[17 of 47 positions shown; findings below may reference images not displayed]

FINDINGS: Brain: Ventricles are normal in size and configuration. All areas of
the brain demonstrate normal gray-white matter attenuation. There is
no mass, hemorrhage, edema or other evidence of acute parenchymal
abnormality. No extra-axial hemorrhage.

Vascular: No hyperdense vessel or unexpected calcification.

Skull: Normal. Negative for fracture or focal lesion.

Sinuses/Orbits: No acute finding.

Other: None.
IMPRESSION: Normal head CT.

## 2023-01-10 ENCOUNTER — Emergency Department
Admission: EM | Admit: 2023-01-10 | Discharge: 2023-01-10 | Disposition: A | Discharge status: Home / Self Care | Payer: Self-pay | Attending: Emergency Medicine | Admitting: Emergency Medicine

## 2023-01-10 ENCOUNTER — Other Ambulatory Visit: Payer: Self-pay

## 2023-01-10 DIAGNOSIS — K047 Periapical abscess without sinus: Secondary | ICD-10-CM | POA: Insufficient documentation

## 2023-01-10 MED ORDER — CLINDAMYCIN HCL 300 MG PO CAPS: 300.0000 mg | ORAL_CAPSULE | Freq: Three times a day (TID) | ORAL | 0 refills | Status: AC

## 2023-01-10 MED ORDER — CHLORHEXIDINE GLUCONATE 0.12 % MT SOLN: 15.0000 mL | Freq: Two times a day (BID) | OROMUCOSAL | 0 refills | Status: AC

## 2023-01-10 NOTE — ED Triage Notes (Signed)
Pt comes with abscess that started two days ago. Pt states he had some bad teeth that need to be pulled. Pt has swelling noted to left side of face.

## 2023-01-10 NOTE — Discharge Instructions (Signed)
Please follow-up with a dentist this week.  Please take the antibiotic as prescribed and also use the mouth rinse.  Please return for any new, worsening, or changing symptoms or other concerns.  It was a pleasure caring for you today.

## 2023-01-10 NOTE — ED Notes (Signed)
See triage note  Presents with dental abscess   States the swelling started about 2 days ago  Hx of poor dentation

## 2023-01-10 NOTE — ED Provider Notes (Signed)
[facility] Provider Note    Event Date/Time   First MD Initiated Contact with Patient 01/10/23 [phone]     (approximate)   History   Abscess   HPI  David Bowman is a 32 y.o. male who presents today for evaluation of left lower dental pain that occurred 2 days ago.  He reports that he knows that he has to see a dentist but has not seen one yet.  No fevers or chills.  No trouble swallowing.  No trouble opening his mouth.  No drooling.  No shortness of breath.  No neck pain or swelling.  There are no problems to display for this patient.         Physical Exam   Triage Vital Signs: ED Triage Vitals  Encounter Vitals Group     BP 01/10/23 0923 127/81     Systolic BP Percentile --      Diastolic BP Percentile --      Pulse Rate 01/10/23 0923 78     Resp 01/10/23 0923 18     Temp 01/10/23 0923 99 F (37.2 C)     Temp Source 01/10/23 0923 Oral     SpO2 01/10/23 0923 99 %     Weight 01/10/23 0927 145 lb 1 oz (65.8 kg)     Height 01/10/23 0927 5\' 9"  (1.753 m)     Head Circumference --      Peak Flow --      Pain Score 01/10/23 0918 10     Pain Loc --      Pain Education --      Exclude from Growth Chart --     Most recent vital signs: Vitals:   01/10/23 0923  BP: 127/81  Pulse: 78  Resp: 18  Temp: 99 F (37.2 C)  SpO2: 99%    Physical Exam Vitals and nursing note reviewed.  Constitutional:      General: Awake and alert. No acute distress.    Appearance: Normal appearance. The patient is normal weight.  HENT:     Head: Normocephalic and atraumatic.     Mouth: Mucous membranes are moist.  Diffusely poor dentition with nearly all of his teeth decayed.  There is tenderness to palpation with gingival erythema without fluctuance to his posterior left lower molars.  No trismus.  No drooling.  No sublingual swelling.  No neck stiffness or swelling.  No facial erythema. Eyes:     General: PERRL. Normal EOMs        Right eye: No discharge.         Left eye: No discharge.     Conjunctiva/sclera: Conjunctivae normal.  Cardiovascular:     Rate and Rhythm: Normal rate and regular rhythm.     Pulses: Normal pulses.  Pulmonary:     Effort: Pulmonary effort is normal. No respiratory distress.     Breath sounds: Normal breath sounds.  Abdominal:     Abdomen is soft. There is no abdominal tenderness. No rebound or guarding. No distention. Musculoskeletal:        General: No swelling. Normal range of motion.     Cervical back: Normal range of motion and neck supple.  Skin:    General: Skin is warm and dry.     Capillary Refill: Capillary refill takes less than 2 seconds.     Findings: No rash.  Neurological:     Mental Status: The patient is awake and alert.      ED  Results / Procedures / Treatments   Labs (all labs ordered are listed, but only abnormal results are displayed) Labs Reviewed - No data to display   EKG     RADIOLOGY     PROCEDURES:  Critical Care performed:   Procedures   MEDICATIONS ORDERED IN ED: Medications - No data to display   IMPRESSION / MDM / ASSESSMENT AND PLAN / ED COURSE  I reviewed the triage vital signs and the nursing notes.   Differential diagnosis includes, but is not limited to, pulpitis, dental abscess, dental caries, dental decay.  Patient was evaluated in the emergency department for dental pain. Patient has tenderness over 1 of her teeth and poor dentition, I suspect some dental decay vs pulpitits. No gingival swelling or fluctuance concerning for gingival abscess.  No trismus, nuchal rigidity, neck pain, hot potato voice, uvular deviation or malocclusion to suggest deep space infection. No sublingual swelling concerning for Ludwig's angina.  Patient was started on antibiotics and chlorhexidine mouth rinse.  Patient declined analgesia.  He was given a list of dental clinics.  Discussed care plan, return precautions, and advised close outpatient follow-up with dentist.  Patient agrees with plan of care.   Patient's presentation is most consistent with acute complicated illness / injury requiring diagnostic workup.    FINAL CLINICAL IMPRESSION(S) / ED DIAGNOSES   Final diagnoses:  Dental infection     Rx / DC Orders   ED Discharge Orders          Ordered    clindamycin (CLEOCIN) 300 MG capsule  3 times daily        01/10/23 0937    chlorhexidine (PERIDEX) 0.12 % solution  2 times daily        01/10/23 1610             Note:  This document was prepared using Dragon voice recognition software and may include unintentional dictation errors.   Jackelyn Hoehn, PA-C 01/10/23 1351    Corena Herter, MD 01/10/23 [phone]
